# Patient Record
Sex: Male | Born: 1995 | Race: Black or African American | Hispanic: No | Marital: Single | Smoking: Smoker, current status unknown
Health system: Southern US, Community
[De-identification: ages and names within clinical notes are randomized; demographics above are authoritative.]

## PROBLEM LIST (undated history)

## (undated) HISTORY — PX: SPLENECTOMY: SUR1306

## (undated) HISTORY — PX: ABDOMINAL SURGERY: SHX537

---

## 2019-05-14 ENCOUNTER — Emergency Department (HOSPITAL_BASED_OUTPATIENT_CLINIC_OR_DEPARTMENT_OTHER)
Admission: EM | Admit: 2019-05-14 | Discharge: 2019-05-14 | Disposition: A | Discharge status: Home / Self Care | Payer: Self-pay | Attending: Emergency Medicine | Admitting: Emergency Medicine

## 2019-05-14 ENCOUNTER — Other Ambulatory Visit: Payer: Self-pay

## 2019-05-14 ENCOUNTER — Encounter (HOSPITAL_BASED_OUTPATIENT_CLINIC_OR_DEPARTMENT_OTHER): Payer: Self-pay

## 2019-05-14 DIAGNOSIS — F1210 Cannabis abuse, uncomplicated: Secondary | ICD-10-CM | POA: Insufficient documentation

## 2019-05-14 DIAGNOSIS — F17210 Nicotine dependence, cigarettes, uncomplicated: Secondary | ICD-10-CM | POA: Insufficient documentation

## 2019-05-14 DIAGNOSIS — Z20828 Contact with and (suspected) exposure to other viral communicable diseases: Secondary | ICD-10-CM | POA: Insufficient documentation

## 2019-05-14 DIAGNOSIS — J029 Acute pharyngitis, unspecified: Secondary | ICD-10-CM | POA: Insufficient documentation

## 2019-05-14 LAB — GROUP A STREP BY PCR: Group A Strep by PCR: NOT DETECTED

## 2019-05-14 NOTE — ED Triage Notes (Addendum)
Pt c/o fatigue, sore throat x 2 days-diarrhea x today-NAD/talking on phone during triage-steady gait

## 2019-05-14 NOTE — Discharge Instructions (Addendum)
Your strep test was negative We have swabbed you for covid today - please stay home and self isolate until you receive your results. If negative you can return to your normal daily activity. If positive you will need to stay home and self isolate for 2 weeks starting today.      Person Under Monitoring Name: Kristopher Williams  Location: [address] [place] [id number]   Infection Prevention Recommendations for Individuals Confirmed to have, or Being Evaluated for, 2019 Novel Coronavirus (COVID-19) Infection Who Receive Care at Home  Individuals who are confirmed to have, or are being evaluated for, COVID-19 should follow the prevention steps below until a healthcare provider or local or state health department says they can return to normal activities.  Stay home except to get medical care You should restrict activities outside your home, except for getting medical care. Do not go to work, school, or public areas, and do not use public transportation or taxis.  Call ahead before visiting your doctor Before your medical appointment, call the healthcare provider and tell them that you have, or are being evaluated for, COVID-19 infection. This will help the healthcare providers office take steps to keep other people from getting infected. Ask your healthcare provider to call the local or state health department.  Monitor your symptoms Seek prompt medical attention if your illness is worsening (e.g., difficulty breathing). Before going to your medical appointment, call the healthcare provider and tell them that you have, or are being evaluated for, COVID-19 infection. Ask your healthcare provider to call the local or state health department.  Wear a facemask You should wear a facemask that covers your nose and mouth when you are in the same room with other people and when you visit a healthcare provider. People who live with or visit you should also wear a facemask while they  are in the same room with you.  Separate yourself from other people in your home As much as possible, you should stay in a different room from other people in your home. Also, you should use a separate bathroom, if available.  Avoid sharing household items You should not share dishes, drinking glasses, cups, eating utensils, towels, bedding, or other items with other people in your home. After using these items, you should wash them thoroughly with soap and water.  Cover your coughs and sneezes Cover your mouth and nose with a tissue when you cough or sneeze, or you can cough or sneeze into your sleeve. Throw used tissues in a lined trash can, and immediately wash your hands with soap and water for at least 20 seconds or use an alcohol-based hand rub.  Wash your [employer]hands Wash your hands often and thoroughly with soap and water for at least 20 seconds. You can use an alcohol-based hand sanitizer if soap and water are not available and if your hands are not visibly dirty. Avoid touching your eyes, nose, and mouth with unwashed hands.   Prevention Steps for Caregivers and Household Members of Individuals Confirmed to have, or Being Evaluated for, COVID-19 Infection Being Cared for in the Home  If you live with, or provide care at home for, a person confirmed to have, or being evaluated for, COVID-19 infection please follow these guidelines to prevent infection:  Follow healthcare providers instructions Make sure that you understand and can help the patient follow any healthcare provider instructions for all care.  Provide for the patients basic needs You should help the  patient with basic needs in the home and provide support for getting groceries, prescriptions, and other personal needs.  Monitor the patients symptoms If they are getting sicker, call his or her medical provider and tell them that the patient has, or is being evaluated for, COVID-19 infection. This will help the  healthcare providers office take steps to keep other people from getting infected. Ask the healthcare provider to call the local or state health department.  Limit the number of people who have contact with the patient If possible, have only one caregiver for the patient. Other household members should stay in another home or place of residence. If this is not possible, they should stay in another room, or be separated from the patient as much as possible. Use a separate bathroom, if available. Restrict visitors who do not have an essential need to be in the home.  Keep older adults, very young children, and other sick people away from the patient Keep older adults, very young children, and those who have compromised immune systems or chronic health conditions away from the patient. This includes people with chronic heart, lung, or kidney conditions, diabetes, and cancer.  Ensure good ventilation Make sure that shared spaces in the home have good air flow, such as from an air conditioner or an opened window, weather permitting.  Wash your hands often Wash your hands often and thoroughly with soap and water for at least 20 seconds. You can use an alcohol based hand sanitizer if soap and water are not available and if your hands are not visibly dirty. Avoid touching your eyes, nose, and mouth with unwashed hands. Use disposable paper towels to dry your hands. If not available, use dedicated cloth towels and replace them when they become wet.  Wear a facemask and gloves Wear a disposable facemask at all times in the room and gloves when you touch or have contact with the patients blood, body fluids, and/or secretions or excretions, such as sweat, saliva, sputum, nasal mucus, vomit, urine, or feces.  Ensure the mask fits over your nose and mouth tightly, and do not touch it during use. Throw out disposable facemasks and gloves after using them. Do not reuse. Wash your hands immediately after  removing your facemask and gloves. If your personal clothing becomes contaminated, carefully remove clothing and launder. Wash your hands after handling contaminated clothing. Place all used disposable facemasks, gloves, and other waste in a lined container before disposing them with other household waste. Remove gloves and wash your hands immediately after handling these items.  Do not share dishes, glasses, or other household items with the patient Avoid sharing household items. You should not share dishes, drinking glasses, cups, eating utensils, towels, bedding, or other items with a patient who is confirmed to have, or being evaluated for, COVID-19 infection. After the person uses these items, you should wash them thoroughly with soap and water.  Wash laundry thoroughly Immediately remove and wash clothes or bedding that have blood, body fluids, and/or secretions or excretions, such as sweat, saliva, sputum, nasal mucus, vomit, urine, or feces, on them. Wear gloves when handling laundry from the patient. Read and follow directions on labels of laundry or clothing items and detergent. In general, wash and dry with the warmest temperatures recommended on the label.  Clean all areas the individual has used often Clean all touchable surfaces, such as counters, tabletops, doorknobs, bathroom fixtures, toilets, phones, keyboards, tablets, and bedside tables, every day. Also, clean any surfaces that  may have blood, body fluids, and/or secretions or excretions on them. Wear gloves when cleaning surfaces the patient has come in contact with. Use a diluted bleach solution (e.g., dilute bleach with 1 part bleach and 10 parts water) or a household disinfectant with a label that says EPA-registered for coronaviruses. To make a bleach solution at home, add 1 tablespoon of bleach to 1 quart (4 cups) of water. For a larger supply, add  cup of bleach to 1 gallon (16 cups) of water. Read labels of cleaning  products and follow recommendations provided on product labels. Labels contain instructions for safe and effective use of the cleaning product including precautions you should take when applying the product, such as wearing gloves or eye protection and making sure you have good ventilation during use of the product. Remove gloves and wash hands immediately after cleaning.  Monitor yourself for signs and symptoms of illness Caregivers and household members are considered close contacts, should monitor their health, and will be asked to limit movement outside of the home to the extent possible. Follow the monitoring steps for close contacts listed on the symptom monitoring form.   ? If you have additional questions, contact your local health department or call the epidemiologist on call at [phone] (available 24/7). ? This guidance is subject to change. For the most up-to-date guidance from [facility], please refer to their website: [email or web address]

## 2019-05-14 NOTE — ED Notes (Signed)
Pt requesting covid test

## 2019-05-14 NOTE — ED Provider Notes (Addendum)
[facility] EMERGENCY DEPARTMENT Provider Note   CSN: [id number] Arrival date & time: 05/14/19  1642     History   Chief Complaint Chief Complaint  Patient presents with  . Fatigue    HPI Joshawn Crissman is a 23 y.o. male who presents to the ED today complaining of gradual onset, constant, achy, sore throat that began yesterday. Pt also complains of increased fatigue and mild diarrhea. He is concern he could have Covid 19. No positive covid 19 exposure.  Patient has been taking DayQuil, NyQuil, drinking hot teas to soothe his throat with mild relief.  Denies fever, chills, ear pain, drooling, inability to swallow, voice change, trismus, abdominal pain, nausea, vomiting, cough, shortness of breath, any other associated symptoms.       History reviewed. No pertinent past medical history.  There are no active problems to display for this patient.   Past Surgical History:  Procedure Laterality Date  . ABDOMINAL SURGERY    . SPLENECTOMY          Home Medications    Prior to Admission medications   Not on File    Family History No family history on file.  Social History Social History   Tobacco Use  . Smoking status: Current Every Day Smoker    Types: Cigars  . Smokeless tobacco: Never Used  Substance Use Topics  . Alcohol use: Not on file  . Drug use: Yes    Types: Marijuana     Allergies   Patient has no known allergies.   Review of Systems Review of Systems  Constitutional: Positive for fatigue. Negative for chills and fever.  HENT: Positive for congestion and sore throat. Negative for drooling, ear discharge, ear pain, facial swelling, rhinorrhea, sinus pressure, sinus pain, trouble swallowing and voice change.   Respiratory: Negative for cough and shortness of breath.   Gastrointestinal: Positive for diarrhea. Negative for abdominal pain, nausea and vomiting.     Physical Exam Updated Vital Signs BP 129/80 (BP Location: Left Arm)   Pulse  72   Temp 99 F (37.2 C) (Oral)   Resp 18   Ht 5\' 6"  (1.676 m)   Wt 53.5 kg   SpO2 98%   BMI 19.05 kg/m   Physical Exam Vitals signs and nursing note reviewed.  Constitutional:      Appearance: He is not ill-appearing.  HENT:     Head: Normocephalic and atraumatic.     Comments: Ears are clear bilaterally. Nose clear. Oropharynx moist, mild erythema and edema noted to tonsils, without uvular swelling or deviation, no trismus or drooling, no exudates.     Right Ear: Tympanic membrane normal.     Left Ear: Tympanic membrane normal.     Nose: Nose normal.     Mouth/Throat:     Mouth: Mucous membranes are moist.     Pharynx: Posterior oropharyngeal erythema present. No oropharyngeal exudate.  Eyes:     Conjunctiva/sclera: Conjunctivae normal.  Cardiovascular:     Rate and Rhythm: Normal rate and regular rhythm.     Pulses: Normal pulses.  Pulmonary:     Effort: Pulmonary effort is normal.     Breath sounds: Normal breath sounds. No wheezing, rhonchi or rales.  Skin:    General: Skin is warm and dry.     Coloration: Skin is not jaundiced.  Neurological:     Mental Status: He is alert.      ED Treatments / Results  Labs (all labs ordered are  listed, but only abnormal results are displayed) Labs Reviewed  GROUP A STREP BY PCR  NOVEL CORONAVIRUS, NAA (HOSP ORDER, SEND-OUT TO REF LAB; TAT 18-24 HRS)    EKG None  Radiology No results found.  Procedures Procedures (including critical care time)  Medications Ordered in ED Medications - No data to display   Initial Impression / Assessment and Plan / ED Course  I have reviewed the triage vital signs and the nursing notes.  Pertinent labs & imaging results that were available during my care of the patient were reviewed by me and considered in my medical decision making (see chart for details).    23 year old male who presents with URI-like symptoms.  He is concerned that he could have COVID-19 and would like to be  tested today.  Exam patient's posterior oropharynx mildly erythematous and edematous, no exudate.  Patient without any red flags today for any deep space infection.  He is afebrile in the ED without tachycardia or tachypnea.  Exam more consistent with strep -she does report history of this.  Will swab for strep as well as COVID today.  If strep negative patient advised he will need to self isolate until he receives his cover test results.   Strep test negative. Pt to be discharged home at this time with instructions to self isolate. He is in agreement with plan at this time. Strict return precautions have been discussed.       Final Clinical Impressions(s) / ED Diagnoses   Final diagnoses:  Viral pharyngitis    ED Discharge Orders    None           Tanda Rockers, PA-C 05/14/19 1831    Tilden Fossa, MD 05/16/19 1243

## 2019-05-15 LAB — NOVEL CORONAVIRUS, NAA (HOSP ORDER, SEND-OUT TO REF LAB; TAT 18-24 HRS): SARS-CoV-2, NAA: NOT DETECTED

## 2020-02-24 ENCOUNTER — Encounter (HOSPITAL_BASED_OUTPATIENT_CLINIC_OR_DEPARTMENT_OTHER): Payer: Self-pay | Admitting: Emergency Medicine

## 2020-02-24 ENCOUNTER — Other Ambulatory Visit: Payer: Self-pay

## 2020-02-24 DIAGNOSIS — F17290 Nicotine dependence, other tobacco product, uncomplicated: Secondary | ICD-10-CM | POA: Insufficient documentation

## 2020-02-24 DIAGNOSIS — R42 Dizziness and giddiness: Secondary | ICD-10-CM | POA: Insufficient documentation

## 2020-02-24 NOTE — ED Triage Notes (Signed)
Patient states that he has had dizzy feelings all day  - he states that when he turns his head it is worse

## 2020-02-25 ENCOUNTER — Emergency Department (HOSPITAL_BASED_OUTPATIENT_CLINIC_OR_DEPARTMENT_OTHER)
Admission: EM | Admit: 2020-02-25 | Discharge: 2020-02-25 | Discharge status: Against Medical Advice | Payer: Self-pay | Attending: Emergency Medicine | Admitting: Emergency Medicine

## 2020-02-25 DIAGNOSIS — R42 Dizziness and giddiness: Secondary | ICD-10-CM

## 2020-02-25 MED ORDER — LORAZEPAM 1 MG PO TABS: 0.5000 mg | ORAL_TABLET | Freq: Once | ORAL | Status: DC

## 2020-02-25 MED ORDER — ONDANSETRON 4 MG PO TBDP
4.0000 mg | ORAL_TABLET | Freq: Once | ORAL | Status: AC
  Administered 2020-02-25: 4 mg via ORAL
  Filled 2020-02-25: qty 1

## 2020-02-25 MED ORDER — MECLIZINE HCL 25 MG PO TABS
25.0000 mg | ORAL_TABLET | Freq: Once | ORAL | Status: AC
  Administered 2020-02-25: 25 mg via ORAL
  Filled 2020-02-25: qty 1

## 2020-02-25 MED ORDER — ALUM & MAG HYDROXIDE-SIMETH 200-200-20 MG/5ML PO SUSP
30.0000 mL | Freq: Once | ORAL | Status: AC
  Administered 2020-02-25: 30 mL via ORAL
  Filled 2020-02-25: qty 30

## 2020-02-25 MED ORDER — FAMOTIDINE 20 MG PO TABS
20.0000 mg | ORAL_TABLET | Freq: Once | ORAL | Status: AC
  Administered 2020-02-25: 20 mg via ORAL
  Filled 2020-02-25: qty 1

## 2020-02-25 NOTE — Discharge Instructions (Signed)
It was our pleasure to provide your ER care today - we hope that you feel better.  Take antivert (meclizine) as need for dizziness (this medication is available over the counter). No driving if/when feeling dizzy.   Follow up with primary care doctor in 1 week if symptoms fail to improve/resolve.  Return to ER if worse, new symptoms, severe dizziness, numbness/weakness, change in speech/vision, problems with balance/coordination, severe ear pain, severe headache, or other concern.

## 2020-02-25 NOTE — ED Provider Notes (Signed)
[facility] EMERGENCY DEPARTMENT Provider Note   CSN: [id number] Arrival date & time: 02/24/20  2249     History Chief Complaint  Patient presents with  . Dizziness    Kristopher Williams is a 24 y.o. male.  Patient c/o dizziness, acute onset earlier today, at rest. States w rapid turning of head, esp when lying flat, will get room spinning sensation. Denies hx vertigo. Denies headache. No ear pain or hearing loss. No tinnitus. No sinus congestion, sore throat or uri symptoms. No fever or chills. No ear trauma. Denies any change in speech or vision. No numbness/weakness. No change in balance, gait, or coordination - in fact states when up, standing, or walking, he doesn't notice the dizziness. No nv.   The history is provided by the patient.  Dizziness Associated symptoms: no chest pain, no headaches, no nausea, no vomiting and no weakness        History reviewed. No pertinent past medical history.  There are no problems to display for this patient.   Past Surgical History:  Procedure Laterality Date  . ABDOMINAL SURGERY    . SPLENECTOMY         History reviewed. No pertinent family history.  Social History   Tobacco Use  . Smoking status: Current Every Day Smoker    Types: Cigars  . Smokeless tobacco: Never Used  Vaping Use  . Vaping Use: Never used  Substance Use Topics  . Alcohol use: Not on file  . Drug use: Yes    Types: Marijuana    Home Medications Prior to Admission medications   Not on File    Allergies    Patient has no known allergies.  Review of Systems   Review of Systems  Constitutional: Negative for chills and fever.  HENT: Negative for congestion, rhinorrhea and sore throat.   Eyes: Negative for visual disturbance.  Respiratory: Negative for cough.   Cardiovascular: Negative for chest pain.  Gastrointestinal: Negative for abdominal pain, nausea and vomiting.  Genitourinary: Negative for flank pain.  Musculoskeletal: Negative for  neck pain.  Skin: Negative for rash.  Neurological: Positive for dizziness. Negative for speech difficulty, weakness, light-headedness, numbness and headaches.  Hematological: Does not bruise/bleed easily.  Psychiatric/Behavioral: Negative for confusion.    Physical Exam Updated Vital Signs BP 124/77   Pulse 57   Temp 98.1 F (36.7 C)   Resp 18   Ht 1.676 m (5\' 6" )   Wt 52.2 kg   SpO2 100%   BMI 18.56 kg/m   Physical Exam Vitals and nursing note reviewed.  Constitutional:      Appearance: Normal appearance. He is well-developed.  HENT:     Head: Atraumatic.     Right Ear: Tympanic membrane normal.     Left Ear: Tympanic membrane normal.     Nose: Nose normal.     Mouth/Throat:     Mouth: Mucous membranes are moist.     Pharynx: Oropharynx is clear.  Eyes:     General: No scleral icterus.    Extraocular Movements: Extraocular movements intact.     Conjunctiva/sclera: Conjunctivae normal.     Pupils: Pupils are equal, round, and reactive to light.  Neck:     Vascular: No carotid bruit.     Trachea: No tracheal deviation.  Cardiovascular:     Rate and Rhythm: Normal rate and regular rhythm.     Pulses: Normal pulses.  Pulmonary:     Effort: Pulmonary effort is normal. No accessory muscle  usage or respiratory distress.  Abdominal:     General: There is no distension.  Genitourinary:    Comments: No cva tenderness. Musculoskeletal:        General: No swelling.     Cervical back: Normal range of motion and neck supple. No rigidity.  Skin:    General: Skin is warm and dry.     Findings: No rash.  Neurological:     Mental Status: He is alert.     Comments: Alert, speech clear/fluent. Motor intact bil, stre 5/5. No pronator drift. Sens grossly intact. Steady gait, no ataxia. Transient unidirectional nystagmus with rapid head movement. No vertical or rotary nystagmus.   Psychiatric:        Mood and Affect: Mood normal.     ED Results / Procedures / Treatments     Labs (all labs ordered are listed, but only abnormal results are displayed) Labs Reviewed - No data to display  EKG None  Radiology No results found.  Procedures Procedures (including critical care time)  Medications Ordered in ED Medications  meclizine (ANTIVERT) tablet 25 mg (has no administration in time range)  ondansetron (ZOFRAN-ODT) disintegrating tablet 4 mg (4 mg Oral Given 02/25/20 0143)    ED Course  I have reviewed the triage vital signs and the nursing notes.  Pertinent labs & imaging results that were available during my care of the patient were reviewed by me and considered in my medical decision making (see chart for details).    MDM Rules/Calculators/A&P                         Antivert po.  Reviewed nursing notes and prior charts for additional history.   Pt currently appears stable for d/c. Rec pcp f/u.   Return precautions provided.    Final Clinical Impression(s) / ED Diagnoses Final diagnoses:  None    Rx / DC Orders ED Discharge Orders    None       Cathren Laine, MD 02/25/20 0201

## 2020-02-25 NOTE — ED Notes (Signed)
Pt states that he vomited a few minutes after he took the antivert. Pt states he did not see the pill when he vomited. MD updated and no orders received at present.

## 2020-02-25 NOTE — ED Notes (Addendum)
Valorie, RN states she saw patient leaving. He did not inform this RN that he was leaving. Pt did not receive d/c instructions or Ativan. Unable to re-assess if the pepcid or Mylanta was effective. MD aware.

## 2020-02-25 NOTE — ED Notes (Signed)
MD with pt  

## 2020-02-25 NOTE — ED Notes (Signed)
MD at bedside to re-evaluate.

## 2020-02-25 NOTE — ED Notes (Signed)
States "I'm burning up ever since I been here." Oral temp 98.31F at this time. Primary RN made aware.

## 2020-12-24 ENCOUNTER — Emergency Department (HOSPITAL_BASED_OUTPATIENT_CLINIC_OR_DEPARTMENT_OTHER): Payer: Self-pay

## 2020-12-24 ENCOUNTER — Emergency Department (HOSPITAL_BASED_OUTPATIENT_CLINIC_OR_DEPARTMENT_OTHER)
Admission: EM | Admit: 2020-12-24 | Discharge: 2020-12-24 | Disposition: A | Discharge status: Home / Self Care | Payer: Self-pay | Attending: Emergency Medicine | Admitting: Emergency Medicine

## 2020-12-24 ENCOUNTER — Other Ambulatory Visit: Payer: Self-pay

## 2020-12-24 ENCOUNTER — Encounter (HOSPITAL_BASED_OUTPATIENT_CLINIC_OR_DEPARTMENT_OTHER): Payer: Self-pay | Admitting: Urology

## 2020-12-24 DIAGNOSIS — S86011A Strain of right Achilles tendon, initial encounter: Secondary | ICD-10-CM | POA: Insufficient documentation

## 2020-12-24 DIAGNOSIS — F17200 Nicotine dependence, unspecified, uncomplicated: Secondary | ICD-10-CM | POA: Insufficient documentation

## 2020-12-24 DIAGNOSIS — W2105XA Struck by basketball, initial encounter: Secondary | ICD-10-CM | POA: Insufficient documentation

## 2020-12-24 DIAGNOSIS — Y9367 Activity, basketball: Secondary | ICD-10-CM | POA: Insufficient documentation

## 2020-12-24 NOTE — ED Triage Notes (Signed)
Playing basketball yesterday, fell to ground with right calf muscle pain and cramping, states unable to bear wt on right leg at this time

## 2020-12-24 NOTE — ED Notes (Signed)
ED Provider at bedside. 

## 2020-12-24 NOTE — ED Notes (Signed)
Pt fitted for crutches and cam walker by tech.  Demonstrated proper use of crutches

## 2020-12-24 NOTE — Discharge Instructions (Addendum)
Return if any problems  Schedule to see the Orthopaedist for evaltuion  

## 2020-12-25 NOTE — ED Provider Notes (Signed)
[facility] EMERGENCY DEPARTMENT Provider Note   CSN: [id number] Arrival date & time: 12/24/20  1010     History Chief Complaint  Patient presents with  . Leg Pain    Kristopher Williams is a 25 y.o. male.  The history is provided by the patient. No language interpreter was used.  Leg Pain Location:  Ankle and leg Time since incident:  1 day Injury: yes   Leg location:  R lower leg Ankle location:  R ankle Pain details:    Quality:  Aching   Severity:  Moderate   Onset quality:  Sudden   Duration:  1 day   Timing:  Constant   Progression:  Worsening Prior injury to area:  No Relieved by:  Nothing Pt was lifting up to jump while shooting basketball and had pain in the back of his ankle up to calf muscle.  Pt now has pain to the back of his ankle      History reviewed. No pertinent past medical history.  There are no problems to display for this patient.   Past Surgical History:  Procedure Laterality Date  . ABDOMINAL SURGERY    . SPLENECTOMY         History reviewed. No pertinent family history.  Social History   Tobacco Use  . Smoking status: Smoker, Current Status Unknown  . Smokeless tobacco: Never Used  Vaping Use  . Vaping Use: Never used  Substance Use Topics  . Alcohol use: Never  . Drug use: Yes    Types: Marijuana    Comment: daily    Home Medications Prior to Admission medications   Not on File    Allergies    Patient has no known allergies.  Review of Systems   Review of Systems  Musculoskeletal: Positive for joint swelling.  All other systems reviewed and are negative.   Physical Exam Updated Vital Signs BP 108/67 (BP Location: Left Arm)   Pulse 91   Temp 98.1 F (36.7 C) (Oral)   Resp 16   Ht 5\' 5"  (1.651 m)   Wt 54.4 kg   SpO2 99%   BMI 19.97 kg/m   Physical Exam Vitals and nursing note reviewed.  Constitutional:      Appearance: He is well-developed.  HENT:     Head: Normocephalic and atraumatic.  Eyes:      Conjunctiva/sclera: Conjunctivae normal.  Cardiovascular:     Rate and Rhythm: Normal rate.  Pulmonary:     Effort: Pulmonary effort is normal. No respiratory distress.  Musculoskeletal:        General: Tenderness present. Normal range of motion.     Cervical back: Neck supple.     Comments: Tender achilles,  Normal plantar flexion and dorsiflexion with pain nv and ns intact   Skin:    General: Skin is warm and dry.  Neurological:     Mental Status: He is alert.  Psychiatric:        Mood and Affect: Mood normal.     ED Results / Procedures / Treatments   Labs (all labs ordered are listed, but only abnormal results are displayed) Labs Reviewed - No data to display  EKG None  Radiology DG Ankle Complete Right  Result Date: 12/24/2020 CLINICAL DATA:  Posterior RIGHT ankle pain, ankle gave out playing basketball last night, injury EXAM: RIGHT ANKLE - COMPLETE 3+ VIEW COMPARISON:  None FINDINGS: Osseous mineralization normal. Joint spaces preserved. No fracture, dislocation, or bone destruction. IMPRESSION: Normal exam.  Electronically Signed   By: Ulyses Southward M.D.   On: 12/24/2020 11:14    Procedures Procedures   Medications Ordered in ED Medications - No data to display  ED Course  I have reviewed the triage vital signs and the nursing notes.  Pertinent labs & imaging results that were available during my care of the patient were reviewed by me and considered in my medical decision making (see chart for details).    MDM Rules/Calculators/A&P                          MDM:  Xray normal,  I suspect achilles injury  Pt placed ina cam walker.  I advised schedule to see Orthopaedist for evaluation  Final Clinical Impression(s) / ED Diagnoses Final diagnoses:  Achilles rupture, right, initial encounter    Rx / DC Orders ED Discharge Orders         Ordered    Crutches        12/24/20 1140        An After Visit Summary was printed and given to the patient.     Osie Cheeks 12/25/20 1111    Benjiman Core, MD 12/25/20 [phone]

## 2022-11-12 IMAGING — CR DG ANKLE COMPLETE 3+V*R*
3 series · 3 of 3 positions shown · non-contrast
Comparison: None

CLINICAL DATA: Posterior RIGHT ankle pain, ankle gave out playing
basketball last night, injury

EXAM:
RIGHT ANKLE - COMPLETE 3+ VIEW

[t ankle joint ap right]
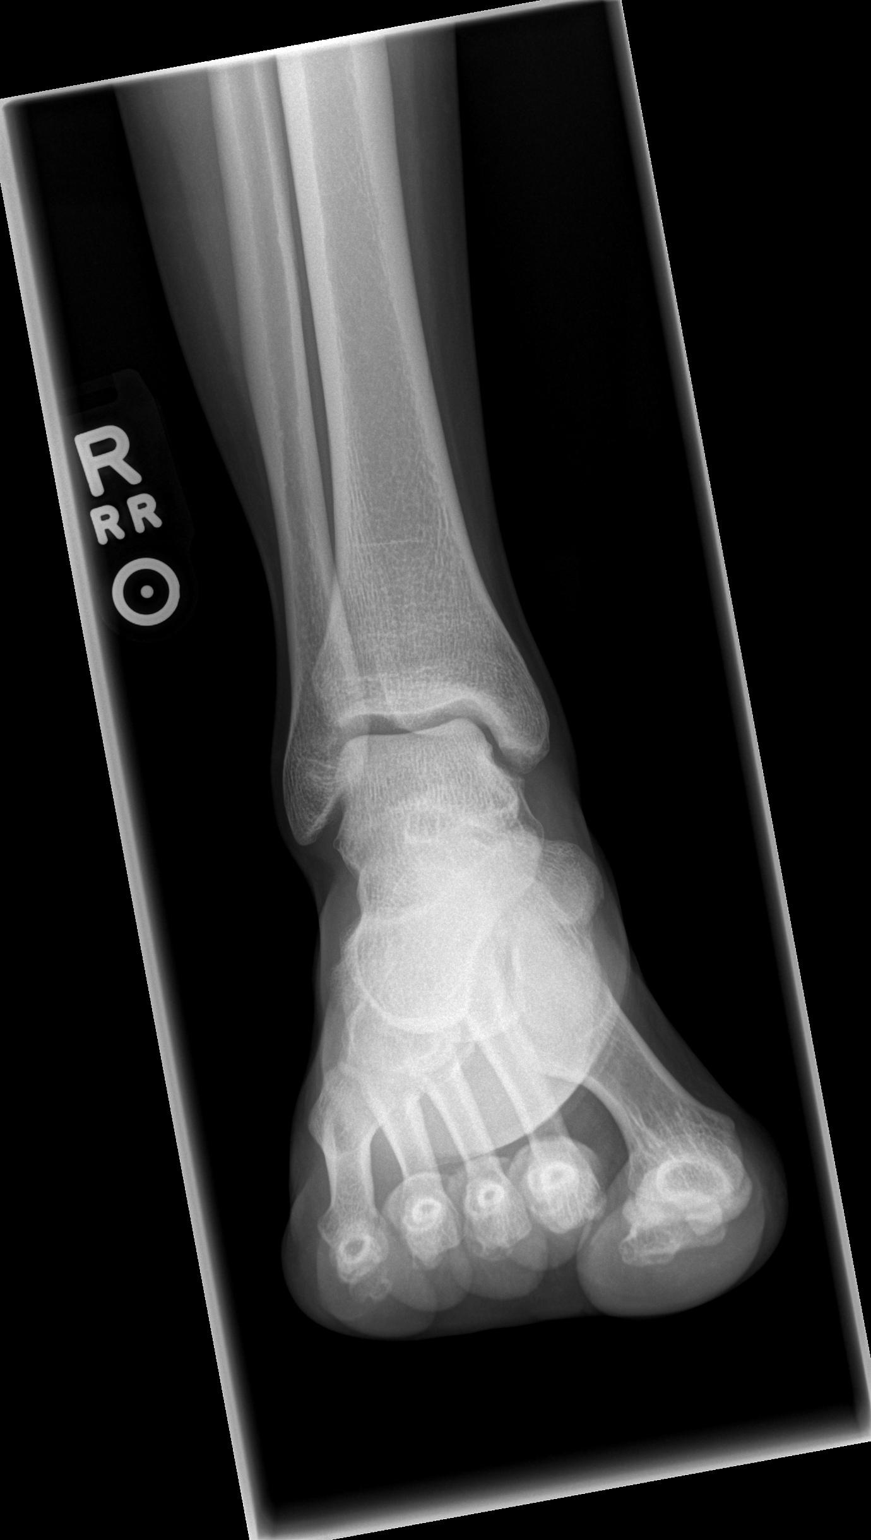

[t ankle joint oblique right]
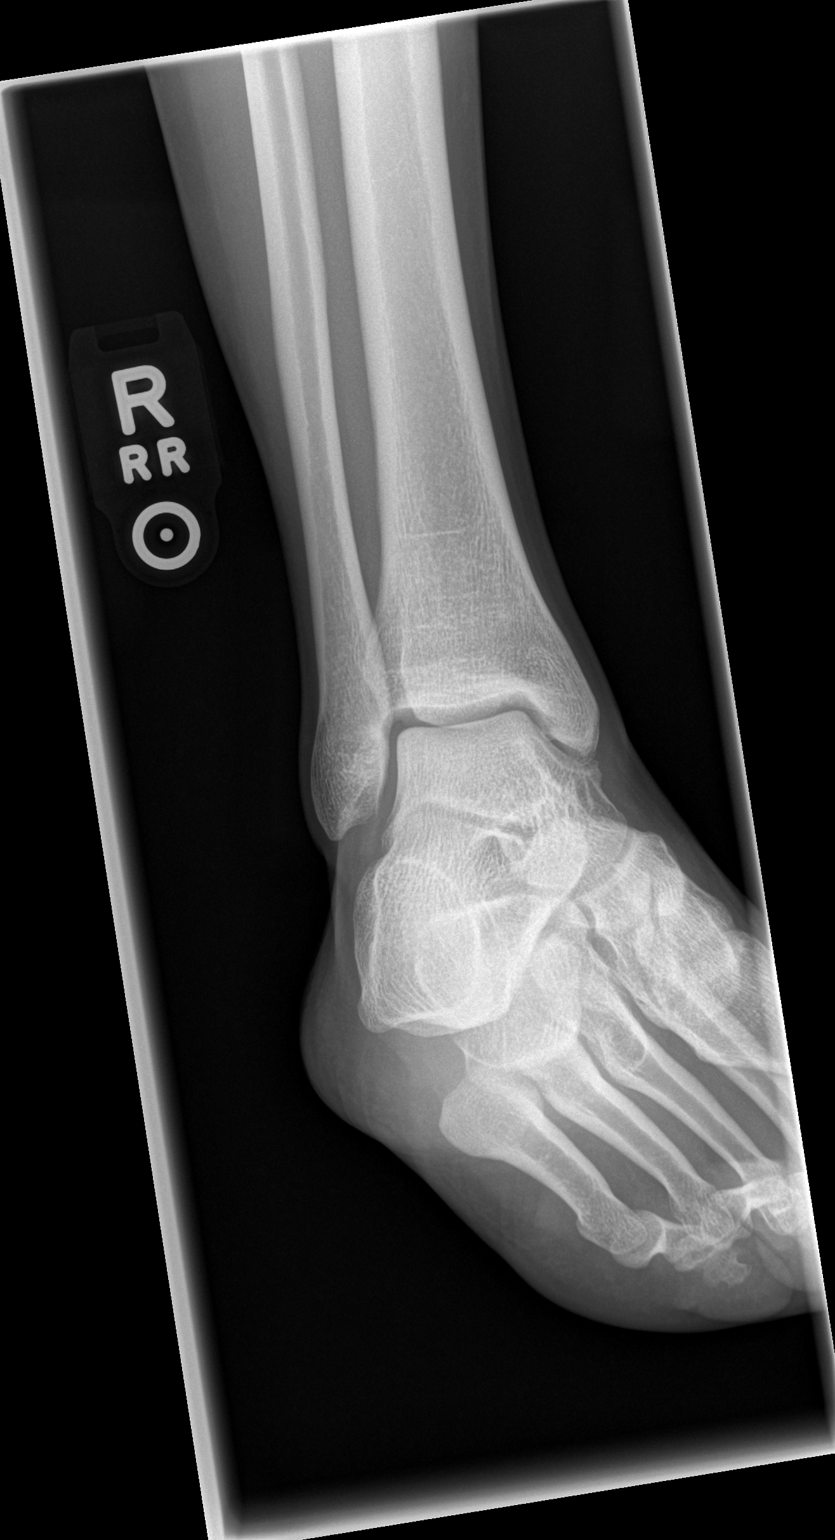

[t ankle joint lat right]
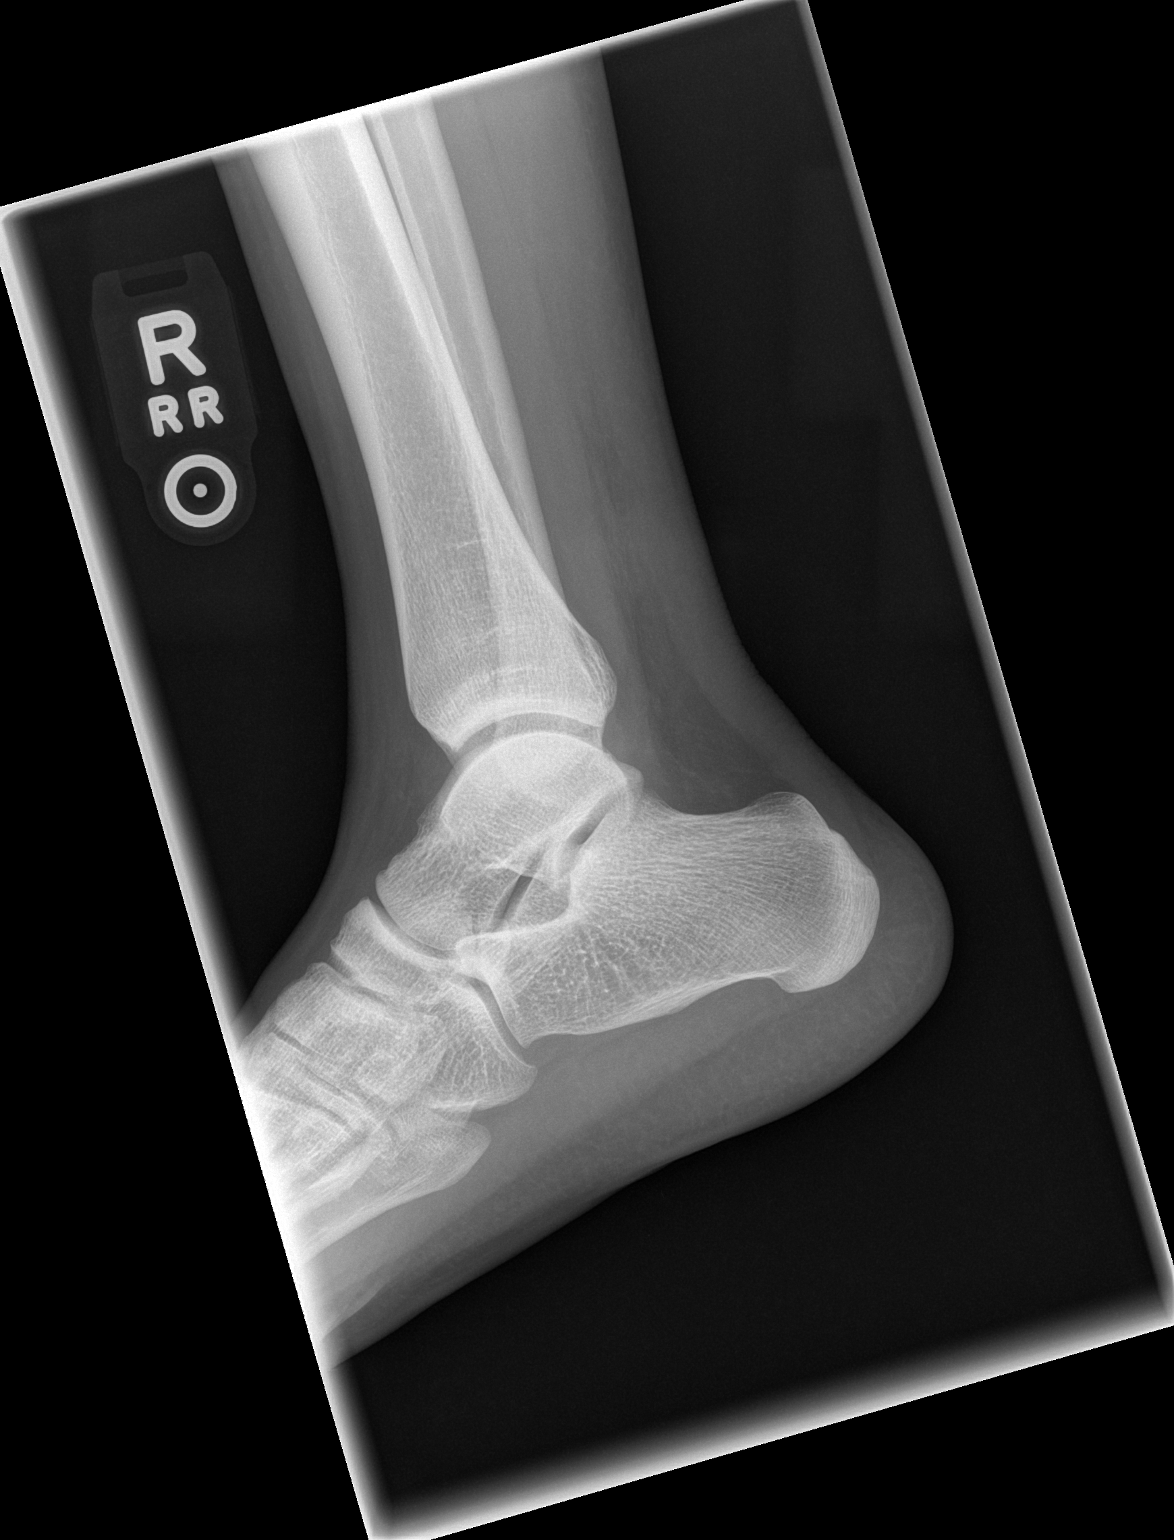

[3 of 3 positions shown; findings below may reference images not displayed]

FINDINGS: Osseous mineralization normal.

Joint spaces preserved.

No fracture, dislocation, or bone destruction.
IMPRESSION: Normal exam.
